# Patient Record
Sex: Male | Born: 1990 | Hispanic: No | Marital: Single | Smoking: Never smoker
Health system: Southern US, Community
[De-identification: ages and names within clinical notes are randomized; demographics above are authoritative.]

## PROBLEM LIST (undated history)

## (undated) HISTORY — PX: TONSILLECTOMY: SUR1361

## (undated) HISTORY — PX: NASAL SINUS SURGERY: SHX719

---
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
## 2012-12-29 ENCOUNTER — Encounter (HOSPITAL_COMMUNITY): Payer: Self-pay | Admitting: *Deleted

## 2012-12-29 ENCOUNTER — Emergency Department (HOSPITAL_COMMUNITY)
Admission: EM | Admit: 2012-12-29 | Discharge: 2012-12-29 | Disposition: A | Discharge status: Home / Self Care | Payer: Worker's Compensation | Attending: Emergency Medicine | Admitting: Emergency Medicine

## 2012-12-29 DIAGNOSIS — Y9289 Other specified places as the place of occurrence of the external cause: Secondary | ICD-10-CM | POA: Insufficient documentation

## 2012-12-29 DIAGNOSIS — Y990 Civilian activity done for income or pay: Secondary | ICD-10-CM | POA: Insufficient documentation

## 2012-12-29 DIAGNOSIS — Y9389 Activity, other specified: Secondary | ICD-10-CM | POA: Insufficient documentation

## 2012-12-29 DIAGNOSIS — S0101XA Laceration without foreign body of scalp, initial encounter: Secondary | ICD-10-CM

## 2012-12-29 DIAGNOSIS — IMO0002 Reserved for concepts with insufficient information to code with codable children: Secondary | ICD-10-CM | POA: Insufficient documentation

## 2012-12-29 DIAGNOSIS — R5381 Other malaise: Secondary | ICD-10-CM | POA: Insufficient documentation

## 2012-12-29 DIAGNOSIS — S0100XA Unspecified open wound of scalp, initial encounter: Secondary | ICD-10-CM | POA: Insufficient documentation

## 2012-12-29 MED ORDER — IBUPROFEN 800 MG PO TABS
800.0000 mg | ORAL_TABLET | Freq: Once | ORAL | Status: AC
  Administered 2012-12-29: 800 mg via ORAL
  Filled 2012-12-29: qty 1

## 2012-12-29 NOTE — ED Notes (Signed)
ZOX:[id number]<AV> Expected date:<BR> Expected time:<BR> Means of arrival:<BR> Comments:<BR> EMS/21 yo male with head lac

## 2012-12-29 NOTE — ED Notes (Signed)
Pt says about an hour ago he reached down to get something and hit his head on a 2x4. He denies LOC. Laceration to the top of his head.

## 2012-12-29 NOTE — ED Provider Notes (Signed)
Medical screening examination/treatment/procedure(s) were performed by non-physician practitioner and as supervising physician I was immediately available for consultation/collaboration.   Glynn Octave, MD 12/29/12 2250

## 2012-12-29 NOTE — ED Notes (Signed)
PA at bedside for Staple application

## 2012-12-29 NOTE — ED Provider Notes (Signed)
History  This chart was scribed for non-physician practitioner Dierdre Forth, PA-C working with Glynn Octave, MD, by Candelaria Stagers, ED Scribe. This patient was seen in room WTR8/WTR8 and the patient's care was started at 8:53 PM   CSN: [id number]  Arrival date & time 12/29/12  2020   First MD Initiated Contact with Patient 12/29/12 2032      Chief Complaint  Patient presents with  . Head Injury     The history is provided by the patient. No language interpreter was used.   Ronald Townsend is a 22 y.o. male who presents to the Emergency Department complaining of a head laceration after he hit his head on a 2x4 with metal hangers on it when coming up from a standing position while at work earlier today.  Pt states that coat hangers were on the 2x4 and he is not sure what cut his head.  Pt denies LOC.  He states he has been experiencing fatigue since the incident, but denies headache, vision changes or vomiting.  He denies headache or neck pain.  Last tetanus shot was two years ago.    PCP [facility].   History reviewed. No pertinent past medical history.  Past Surgical History  Procedure Laterality Date  . Nasal sinus surgery    . Tonsillectomy      No family history on file.  History  Substance Use Topics  . Smoking status: Never Smoker   . Smokeless tobacco: Not on file  . Alcohol Use: Yes      Review of Systems  Constitutional: Positive for fatigue.  HENT: Negative for neck pain.   Gastrointestinal: Negative for nausea and vomiting.  Skin: Positive for wound (laceration to posterior scalp).  Neurological: Negative for syncope.  All other systems reviewed and are negative.    Allergies  Review of patient's allergies indicates no known allergies.  Home Medications   Current Outpatient Rx  Name  Route  Sig  Dispense  Refill  . ibuprofen (ADVIL,MOTRIN) 200 MG tablet   Oral   Take 200 mg by mouth every 6 (six) hours as needed for pain.          Marland Kitchen loratadine (CLARITIN) 10 MG tablet   Oral   Take 10 mg by mouth daily.         . mometasone (NASONEX) 50 MCG/ACT nasal spray   Nasal   Place 2 sprays into the nose daily.           BP 134/83  Pulse 88  Temp(Src) 98.9 F (37.2 C) (Oral)  Resp 16  SpO2 99%  Physical Exam  Nursing note and vitals reviewed. Constitutional: He is oriented to person, place, and time. He appears well-developed and well-nourished. No distress.  HENT:  Head: Normocephalic.  Right Ear: Hearing and external ear normal.  Left Ear: Hearing and external ear normal.  Nose: Nose normal. No mucosal edema or rhinorrhea.  Mouth/Throat: Oropharynx is clear and moist. No edematous. No oropharyngeal exudate, posterior oropharyngeal edema, posterior oropharyngeal erythema or tonsillar abscesses.  Laceration to left temporal region of the scalp No hematoma or contusion  Eyes: Conjunctivae are normal. No scleral icterus.  Neck: Normal range of motion. Neck supple.  Cardiovascular: Normal rate, regular rhythm and intact distal pulses.   Pulmonary/Chest: Effort normal and breath sounds normal. No respiratory distress. He has no wheezes.  Abdominal: Soft. Bowel sounds are normal. He exhibits no mass. There is no tenderness. There is no rebound and no  guarding.  Musculoskeletal: Normal range of motion. He exhibits tenderness (mild tenderness at the site of the laceration; no hematoma or contusion). He exhibits no edema.  Neurological: He is alert and oriented to person, place, and time. He has normal reflexes. No cranial nerve deficit. He exhibits normal muscle tone. Coordination normal.  Speech is clear and goal oriented, follows commands Major Cranial nerves without deficit, no facial droop Normal strength in upper and lower extremities bilaterally including dorsiflexion and plantar flexion, strong and equal grip strength Sensation normal to light and sharp touch Moves extremities without ataxia, coordination  intact Normal finger to nose and rapid alternating movements Normal gait and balance  Skin: Skin is warm and dry. No rash noted. He is not diaphoretic. No erythema.  6cm laceration to the L temporal region of the scalp   Psychiatric: He has a normal mood and affect. His behavior is normal.    ED Course  LACERATION REPAIR Date/Time: 12/29/2012 9:38 PM Performed by: Dierdre Forth Authorized by: Dierdre Forth Consent: Verbal consent obtained. Risks and benefits: risks, benefits and alternatives were discussed Consent given by: patient Patient understanding: patient states understanding of the procedure being performed Patient consent: the patient's understanding of the procedure matches consent given Procedure consent: procedure consent matches procedure scheduled Relevant documents: relevant documents present and verified Site marked: the operative site was marked Required items: required blood products, implants, devices, and special equipment available Patient identity confirmed: verbally with patient and arm band Time out: Immediately prior to procedure a "time out" was called to verify the correct patient, procedure, equipment, support staff and site/side marked as required. Body area: head/neck Location details: scalp Laceration length: 6 cm Foreign bodies: no foreign bodies Tendon involvement: none Nerve involvement: none Vascular damage: no Patient sedated: no Preparation: Patient was prepped and draped in the usual sterile fashion. Irrigation solution: saline Irrigation method: syringe Amount of cleaning: standard Debridement: none Degree of undermining: none Skin closure: staples Number of sutures: 5 Technique: simple Approximation: close Approximation difficulty: simple Patient tolerance: Patient tolerated the procedure well with no immediate complications.     DIAGNOSTIC STUDIES: Oxygen Saturation is 99% on room air, normal by my interpretation.     COORDINATION OF CARE:  9:41 PM Discussed course of care with pt which includes laceration repair.  Gave strict post concussion return precautions.  Pt understands and agrees.   LACERATION REPAIR PROCEDURE NOTE The patient's identification was confirmed and consent was obtained. This procedure was performed by Dierdre Forth, PA-C at 8:59 PM. Site: left temporal scalp Sterile procedures observed Suture type/size: staples Length: 6 cm # of Sutures: 5 staples Tetanus UTD Site anesthetized, irrigated with NS, explored without evidence of foreign body, wound well approximated, site covered with dry, sterile dressing.  Patient tolerated procedure well without complications. Instructions for care discussed verbally and patient provided with additional written instructions for homecare and f/u.   Labs Reviewed - No data to display No results found.   1. Laceration of scalp without foreign body, initial encounter       MDM  Sandi Mealy presents for scalp laceration.  Tdap UTD < 5 years, not repeated.Pressure irrigation performed. Laceration occurred < 8 hours prior to repair which was well tolerated. Pt has no co morbidities to effect normal wound healing. Discussed suture home care w pt and answered questions. Pt to f-u for wound check and suture removal in 7 days. Pt without evidence of traumatic brain injury,  No evidence of concussion.  Pt with normal neurologic exam, A&Ox4, NAD, nontoxic, nonseptic appearing and ambulating without difficulty.  Pt is hemodynamically stable w no complaints prior to dc.  I have also discussed reasons to return immediately to the ER.  Patient expresses understanding and agrees with plan.   I personally performed the services described in this documentation, which was scribed in my presence. The recorded information has been reviewed and is accurate.   Dahlia Client Kreed Kauffman, PA-C 12/29/12 2141

## 2013-06-22 ENCOUNTER — Encounter: Payer: Self-pay | Admitting: *Deleted

## 2013-06-22 NOTE — Progress Notes (Signed)
Per Clydie Braun - schedule a lab appt on 11/11 at 10am.

## 2013-07-11 ENCOUNTER — Other Ambulatory Visit: Payer: BC Managed Care – PPO

## 2013-07-11 ENCOUNTER — Ambulatory Visit (HOSPITAL_BASED_OUTPATIENT_CLINIC_OR_DEPARTMENT_OTHER): Payer: BC Managed Care – PPO | Admitting: Genetic Counselor

## 2013-07-11 ENCOUNTER — Encounter: Payer: Self-pay | Admitting: Genetic Counselor

## 2013-07-11 DIAGNOSIS — Z1509 Genetic susceptibility to other malignant neoplasm: Secondary | ICD-10-CM

## 2013-07-11 DIAGNOSIS — Z803 Family history of malignant neoplasm of breast: Secondary | ICD-10-CM

## 2013-07-11 DIAGNOSIS — Z8349 Family history of other endocrine, nutritional and metabolic diseases: Secondary | ICD-10-CM

## 2013-07-11 DIAGNOSIS — IMO0002 Reserved for concepts with insufficient information to code with codable children: Secondary | ICD-10-CM

## 2013-07-11 NOTE — Progress Notes (Signed)
Ronald Townsend, a 22 y.o. male patient of Dr. Mila Palmer, was seen for discussion of his family history of hemochromatosis and breast cancer resulting from an ATM and CHEK2 mutation.  He presents to clinic today to discuss the possibility of a genetic predisposition to cancer, and to further clarify his risks, as well as his family members' risks for cancer.   HISTORY OF PRESENT ILLNESS: Ronald Townsend is a 22 y.o. male with no personal history of cancer.  He has never had a colonoscopy and has not started population screening for cancer.  No past medical history on file.  Past Surgical History  Procedure Laterality Date  . Nasal sinus surgery    . Tonsillectomy      History   Social History  . Marital Status: Single    Spouse Name: N/A    Number of Children: 0  . Years of Education: N/A   Occupational History  .     Social History Main Topics  . Smoking status: Never Smoker   . Smokeless tobacco: Not on file  . Alcohol Use: Yes  . Drug Use: No  . Sexual Activity: Not on file   Other Topics Concern  . Not on file   Social History Narrative  . No narrative on file   FAMILY HISTORY:  We obtained a detailed, 4-generation family history.  Significant diagnoses are listed below: Family History  Problem Relation Age of Onset  . Hemochromatosis Mother     carrier  . Prostate cancer Father 83  . Hemochromatosis Father     carrier  . Hemochromatosis Sister 27    affected  . Breast cancer Sister 80    CHEK2 and ATM mutaton  . Melanoma Maternal Uncle 40  . Breast cancer Paternal Aunt 51  . Breast cancer Paternal Grandmother 30  . HIV/AIDS Maternal Uncle   . Breast cancer Other     paternal great grandmother dx in her 89s   Patient's maternal ancestors are of [place] descent, and paternal ancestors are of [place] descent. There is no reported Ashkenazi Jewish ancestry. There is no known consanguinity.  GENETIC COUNSELING ASSESSMENT:  Ronald Townsend is a 22 y.o. male with a family history of a familial mutation in ATM and CHEK2 which increases the risk for cancer and Hemochromatosis which is an iron storage disease. We, therefore, discussed and recommended the following at today's visit.   DISCUSSION: We reviewed the characteristics, features and inheritance patterns of hereditary cancer syndromes. We also discussed genetic testing, including the appropriate family members to test, the process of testing, insurance coverage and turn-around-time for results. The ATM and CHEK2 genes are involved in the detection and surveillance of DNA damage. ATM phosphorylation of CHEK2 and BRCA1 (as well as CHEK2 phosphorylation of BRCA1) is critical for proper response to DNA double-strand breaks. This is believed to be the role ATM and CHEK2 have in breast cancer risk. ATM and CHEK2 are considered to be moderate risk breast cancer genes.   Having one ATM gene mutation increases the risk 5-fold for breast cancer under the age of 70, and 2-3 fold for breast cancer overall. In families with familial breast cancer that were negative for BRCA1 or BRCA2 genes, approximately 2.7% of women were found to have one ATM mutation. ATM mutations increase the risk for other cancers as well, although the exact risks have not been quantified. Other cancers include colon and pancreatic cancer.   Having one CHEK2  mutation increases the lifetime risk for breast cancer in women between 23-48%, depending on family history. An increased risk for male breast cancer is also seen, with about a 1% lifetime risk. Other cancers can also be seen in families with CHEK2 mutations, including prostate and colon cancer. Again, these risks have yet to be fully quantified. Ms. Lafitte sister has one of the common CHEK2 mutations, which is seen in about 1-2% of [place] European populations, that are BRCA negative.   Ronald Townsend has a 50% chance of carrying an ATM mutation and a 50%  chance of carrying a CHEK2 mutation.  Put another way, he has a 25% chance of carrying both mutations, a 50% chance of carrying one mutation or a 25% chance of not carrying a mutation at all.  Lastly, we discussed Hemochromatosis.  This is an iron storage condition that can increase the amount of iron in our blood and ultimately it can be stored in the liver.  Treatment for Hemochromatosis includes having blood drawn until the iron levels have normalized.  It is a recessive condition, and therefore Ronald Townsend has a 25% chance of being affected, and a 50% chance of being a carrier.  PLAN: After considering the risks, benefits, and limitations, Ronald Townsend provided informed consent to pursue genetic testing and the blood sample will be sent to [facility] for analysis of the CHEK2 and ATM genes. He also had blood drawn for hemochromatosis testing.   We discussed the implications of a positive, negative and/ or variant of uncertain significance genetic test result. Results should be available within approximately 1-2 weeks' time, at which point they will be disclosed by telephone to Ronald Townsend, as will any additional recommendations warranted by these results. Ronald Townsend will receive a summary of his genetic counseling visit and a copy of his results once available. This information will also be available in Epic. We encouraged Ronald Townsend to remain in contact with cancer genetics annually so that we can continuously update the family history and inform him of any changes in cancer genetics and testing that may be of benefit for his family. Ronald Townsend's questions were answered to his satisfaction today. Our contact information was provided should additional questions or concerns arise.  The patient was seen for a total of 60 minutes, greater than 50% of which was spent face-to-face counseling.  This note will also be sent to the referring provider via the electronic medical  record. The patient will be supplied with a summary of this genetic counseling discussion as well as educational information on the discussed hereditary cancer syndromes following the conclusion of their visit.   Patient was discussed with Dr. Drue Second.   _______________________________________________________________________ For Office Staff:  Number of people involved in session: 4 Was an Intern/ student involved with case: no

## 2013-07-19 ENCOUNTER — Telehealth: Payer: Self-pay | Admitting: Genetic Counselor

## 2013-07-19 NOTE — Telephone Encounter (Signed)
Revealed negative genetic test results.  I do not have the results on the hemochromatosis testing, but Dr. Cyndie Chime should be contacting them about that.

## 2013-07-20 ENCOUNTER — Encounter: Payer: Self-pay | Admitting: Genetic Counselor

## 2014-12-05 ENCOUNTER — Other Ambulatory Visit: Payer: Self-pay | Admitting: Family Medicine

## 2014-12-05 ENCOUNTER — Ambulatory Visit
Admission: RE | Admit: 2014-12-05 | Discharge: 2014-12-05 | Disposition: A | Discharge status: Home / Self Care | Payer: BLUE CROSS/BLUE SHIELD | Source: Ambulatory Visit | Attending: Family Medicine | Admitting: Family Medicine

## 2014-12-05 DIAGNOSIS — R109 Unspecified abdominal pain: Principal | ICD-10-CM

## 2014-12-05 DIAGNOSIS — G8929 Other chronic pain: Secondary | ICD-10-CM

## 2015-03-08 ENCOUNTER — Other Ambulatory Visit: Payer: Self-pay | Admitting: Psychiatry

## 2015-03-11 ENCOUNTER — Other Ambulatory Visit: Payer: Self-pay | Admitting: Psychiatry

## 2015-03-11 DIAGNOSIS — M5416 Radiculopathy, lumbar region: Secondary | ICD-10-CM

## 2015-03-18 ENCOUNTER — Other Ambulatory Visit: Payer: Self-pay

## 2015-03-19 ENCOUNTER — Ambulatory Visit
Admission: RE | Admit: 2015-03-19 | Discharge: 2015-03-19 | Disposition: A | Discharge status: Home / Self Care | Payer: BLUE CROSS/BLUE SHIELD | Source: Ambulatory Visit | Attending: Psychiatry | Admitting: Psychiatry

## 2015-03-19 DIAGNOSIS — M5416 Radiculopathy, lumbar region: Secondary | ICD-10-CM

## 2015-03-22 ENCOUNTER — Other Ambulatory Visit: Payer: Self-pay | Admitting: Psychiatry

## 2015-03-22 DIAGNOSIS — M546 Pain in thoracic spine: Secondary | ICD-10-CM

## 2015-03-30 ENCOUNTER — Ambulatory Visit
Admission: RE | Admit: 2015-03-30 | Discharge: 2015-03-30 | Disposition: A | Discharge status: Home / Self Care | Payer: BLUE CROSS/BLUE SHIELD | Source: Ambulatory Visit | Attending: Psychiatry | Admitting: Psychiatry

## 2015-03-30 DIAGNOSIS — M546 Pain in thoracic spine: Secondary | ICD-10-CM

## 2016-05-24 IMAGING — CR DG LUMBAR SPINE COMPLETE 4+V
4 series · 4 of 4 positions shown · non-contrast
Comparison: CT 12/05/2014 .

CLINICAL DATA: Pain radiating down right leg.  Initial evaluation .

EXAM:
LUMBAR SPINE - COMPLETE 4+ VIEW

[w lumbar spine ap]
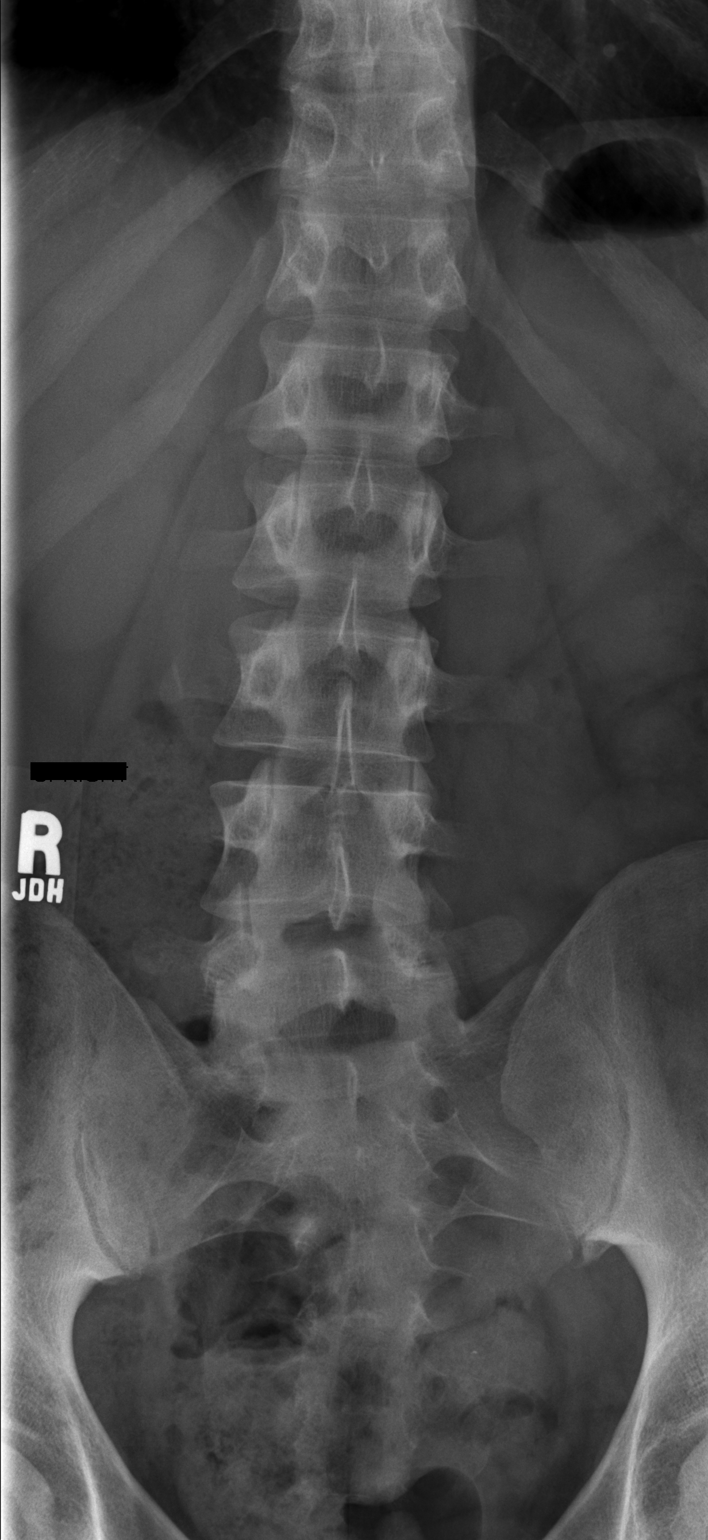

[w lumbar spine lat]
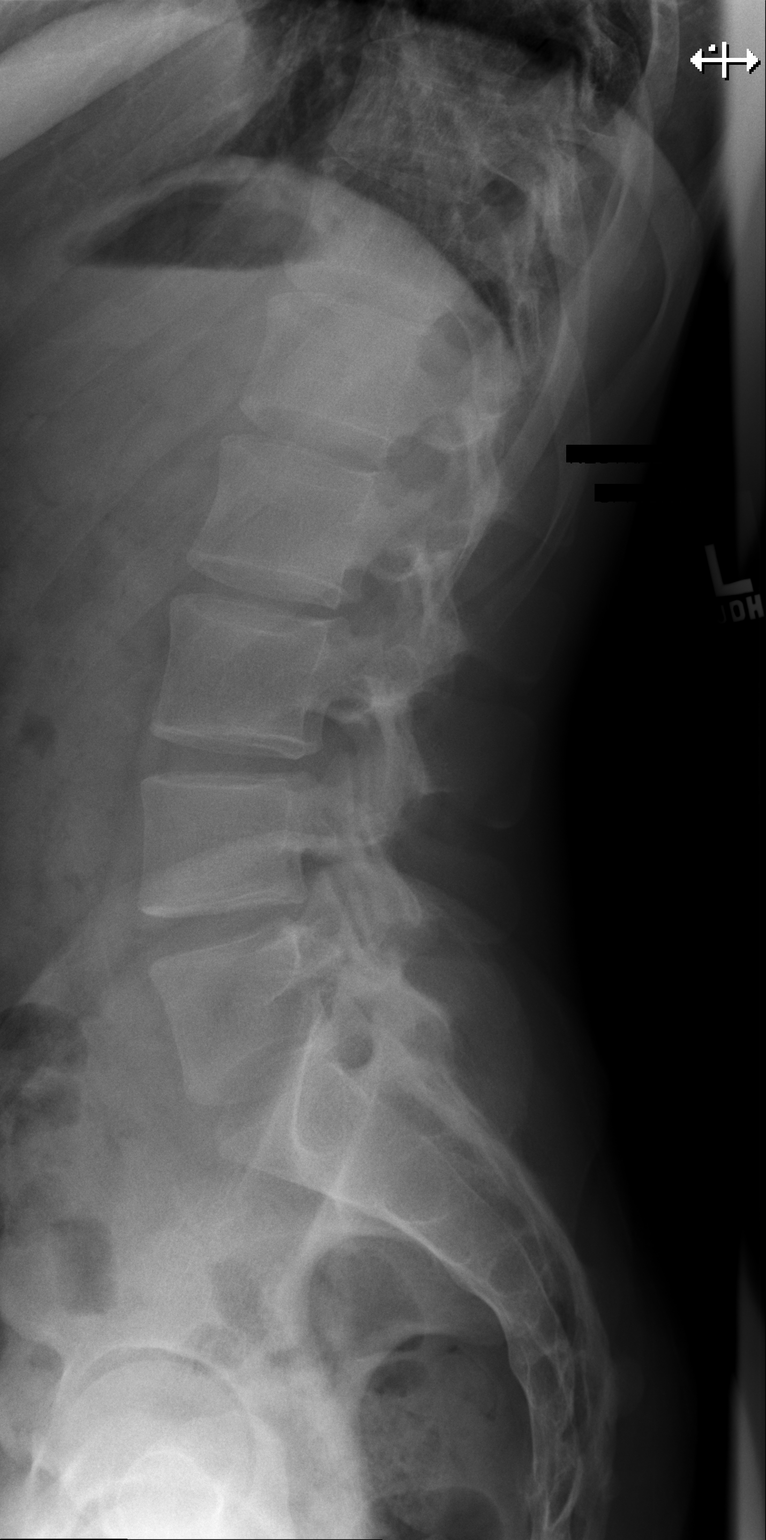

[w lumbar spine flexion]
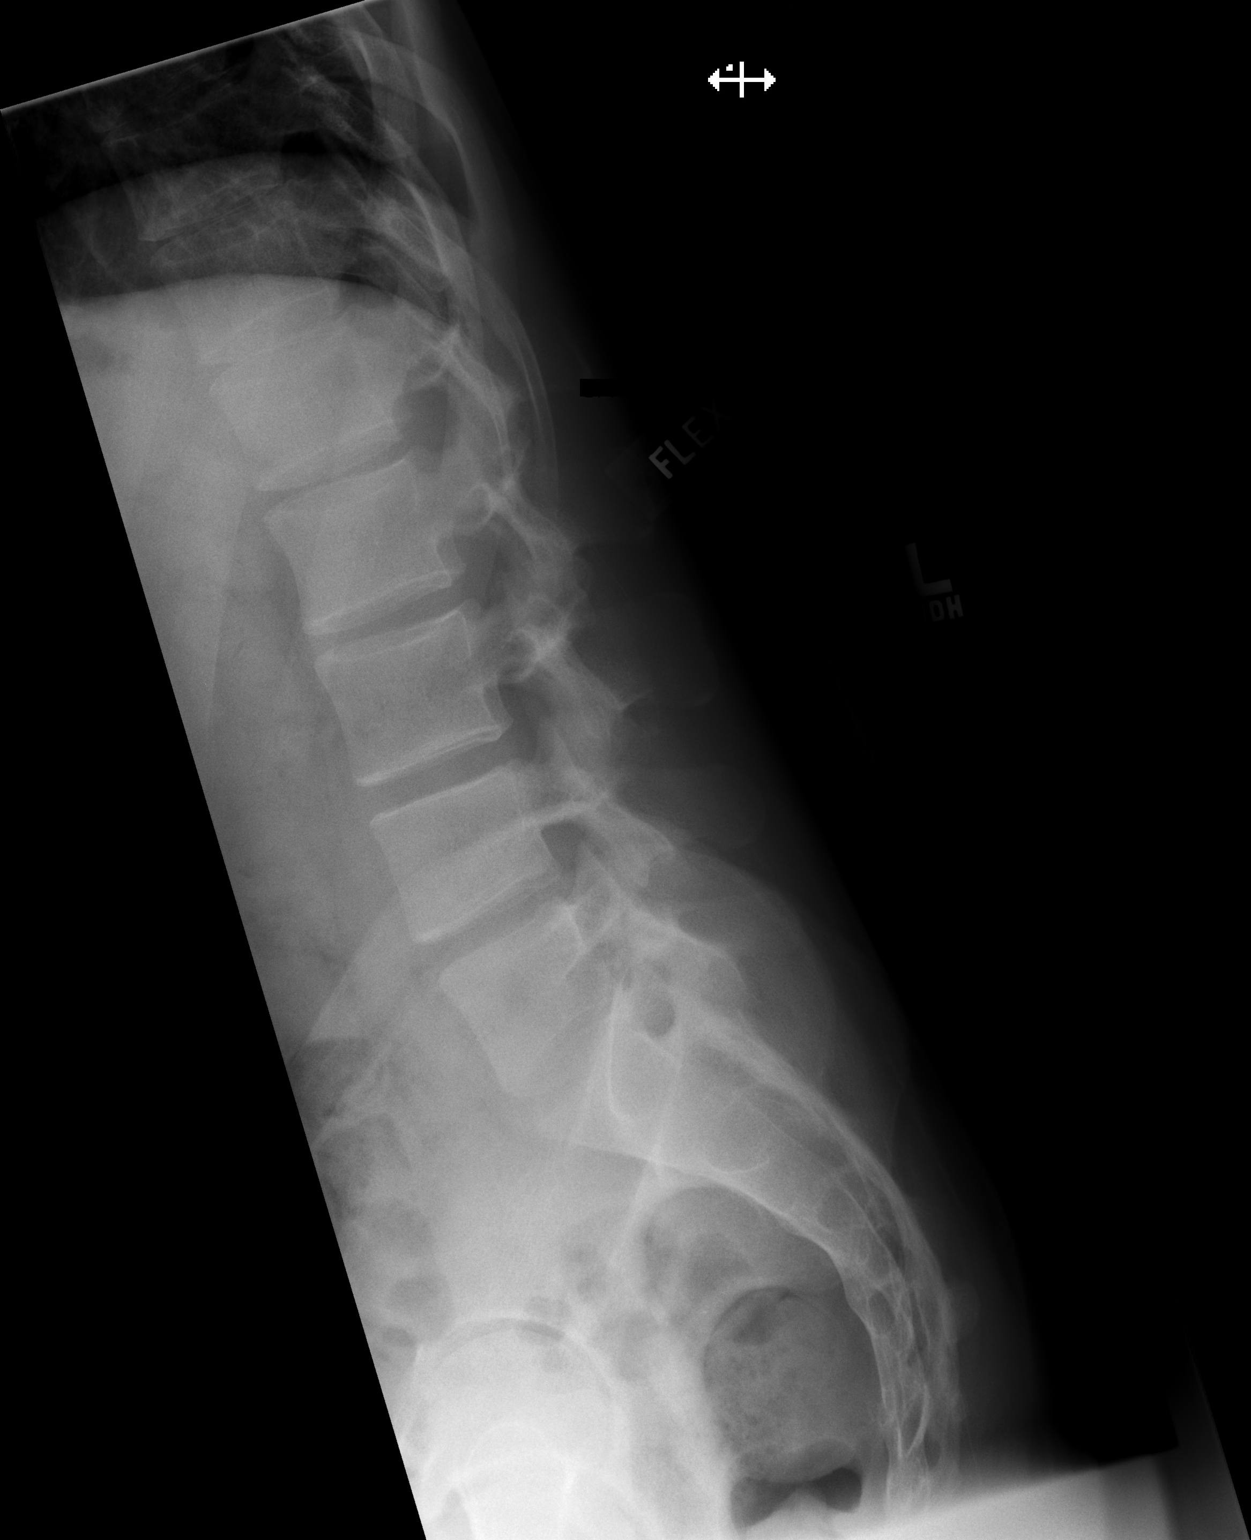

[w lumbar spine extension]
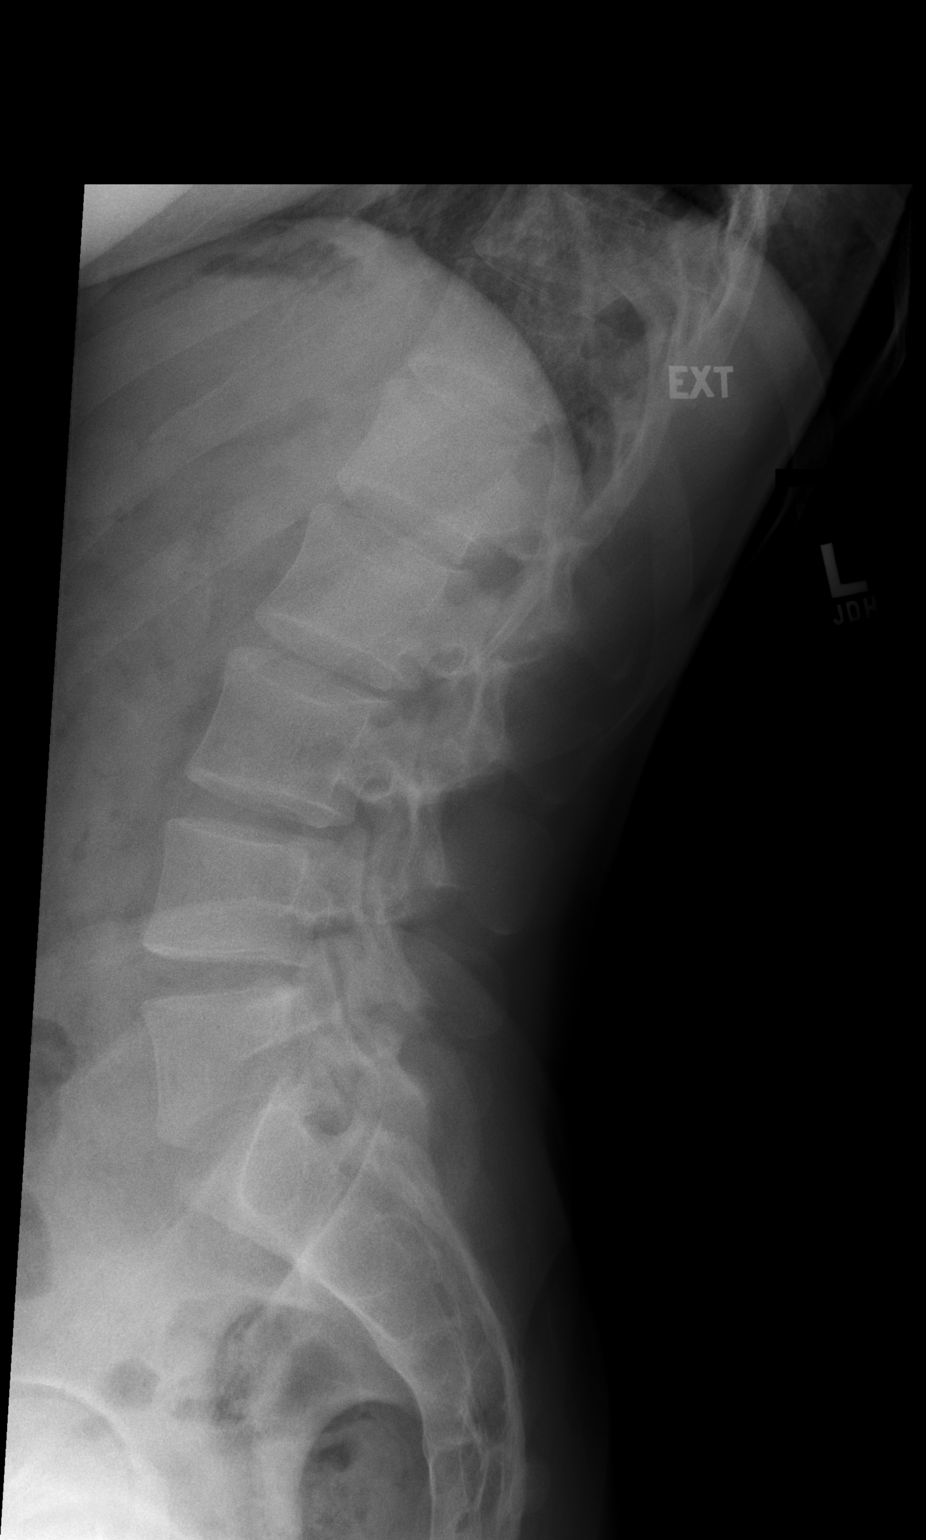

[4 of 4 positions shown; findings below may reference images not displayed]

FINDINGS: Soft tissues are unremarkable. Mild scoliosis concave left. No acute
bony abnormality identified. No flexion or extension deformity.
IMPRESSION: Mild scoliosis concave left. No acute or focal abnormality. No
flexion or extension deformity.

## 2017-12-27 DIAGNOSIS — H10022 Other mucopurulent conjunctivitis, left eye: Secondary | ICD-10-CM | POA: Diagnosis not present

## 2018-03-19 ENCOUNTER — Ambulatory Visit (HOSPITAL_COMMUNITY)
Admission: EM | Admit: 2018-03-19 | Discharge: 2018-03-19 | Disposition: A | Discharge status: Home / Self Care | Payer: Commercial Managed Care - PPO | Attending: Family Medicine | Admitting: Family Medicine

## 2018-03-19 ENCOUNTER — Encounter (HOSPITAL_COMMUNITY): Payer: Self-pay

## 2018-03-19 ENCOUNTER — Other Ambulatory Visit: Payer: Self-pay

## 2018-03-19 DIAGNOSIS — A630 Anogenital (venereal) warts: Secondary | ICD-10-CM | POA: Insufficient documentation

## 2018-03-19 DIAGNOSIS — A64 Unspecified sexually transmitted disease: Secondary | ICD-10-CM | POA: Diagnosis not present

## 2018-03-19 MED ORDER — IMIQUIMOD 5 % EX CREA: TOPICAL_CREAM | CUTANEOUS | 0 refills | Status: AC

## 2018-03-19 NOTE — ED Provider Notes (Signed)
  [facility]    CSN: [id number] Arrival date & time: 03/19/18  1256  Chief Complaint  Patient presents with  . SEXUALLY TRANSMITTED DISEASE    Subjective: Patient is a 27 y.o. male here for bump on penis.  This is a recurrent issue. Happened in past and had it frozen off. Has been using Imiquimod that has helped a little but he is running out. Is in monogamous relationship, no concerns for partner. Testes have been intermittently painful over past 2 days. No inj or change in activity. He is a runner and stays active, wears boxer shorts. Denies urinary complaints.  ROS: GU: As noted in HPI  History reviewed. No pertinent past medical history.  Objective: BP (!) 147/86 (BP Location: Left Arm)   Pulse 72   Temp 98.3 F (36.8 C) (Oral)   Resp 16   SpO2 100%  General: Awake, appears stated age HEENT: MMM, EOMi Heart: RRR, no murmurs Lungs: CTAB, no rales, wheezes or rhonchi. No accessory muscle use Abd: Soft, NT, ND, BS+ GU: there is a wart on L side of shaft, no vesicles; testes non-tender to palpation, circumcised, no other ext lesions Psych: Age appropriate judgment and insight, normal affect and mood  Assessment and Plan: Genital warts  Refill Aldara. Ck urine ancillary. F/u with derm team for discussion of possible surveillance.  The patient voiced understanding and agreement to the plan.       Sharlene DoryWendling, Nicholas Paul, DO 03/19/18 1418

## 2018-03-19 NOTE — Discharge Instructions (Addendum)
Ibuprofen 400-600 mg (2-3 over the counter strength tabs) every 6 hours as needed for pain.  OK to take Tylenol 1000 mg (2 extra strength tabs) or 975 mg (3 regular strength tabs) every 6 hours as needed.  Make an appointment with your dermatologist if this does not resolve. Might be worth it to discuss surveillance moving forward.  Give us 5-6 days to get the results of your testing back. In general, no news is good news. If you don't hear anything by next Friday and are curious, give us a call.

## 2018-03-19 NOTE — ED Triage Notes (Signed)
[facility] on penis, and slight throbbing that comes and goes on testicles x3 days, pt has hx of STD and would like to be checked again

## 2018-03-21 LAB — URINE CYTOLOGY ANCILLARY ONLY
CHLAMYDIA, DNA PROBE: NEGATIVE
Neisseria Gonorrhea: NEGATIVE
Trichomonas: NEGATIVE

## 2019-07-10 ENCOUNTER — Other Ambulatory Visit: Payer: Self-pay

## 2019-07-10 DIAGNOSIS — Z20822 Contact with and (suspected) exposure to covid-19: Secondary | ICD-10-CM

## 2019-07-11 LAB — NOVEL CORONAVIRUS, NAA: SARS-CoV-2, NAA: NOT DETECTED

## 2019-12-09 ENCOUNTER — Ambulatory Visit: Payer: Commercial Managed Care - PPO | Attending: Internal Medicine

## 2019-12-09 DIAGNOSIS — Z23 Encounter for immunization: Secondary | ICD-10-CM

## 2019-12-09 NOTE — Progress Notes (Signed)
   Covid-19 Vaccination Clinic  Name:  Ronald Townsend    MRN: [id number] DOB: [birth year]  12/09/2019  [name] was observed post Covid-19 immunization for 15 minutes without incident. He was provided with Vaccine Information Sheet and instruction to access the V-Safe system.   Mr. See was instructed to call 911 with any severe reactions post vaccine: Marland Kitchen Difficulty breathing  . Swelling of face and throat  . A fast heartbeat  . A bad rash all over body  . Dizziness and weakness   Immunizations Administered    Name Date Dose VIS Date Route   Pfizer COVID-19 Vaccine 12/09/2019 10:33 AM 0.3 mL 08/11/2019 Intramuscular   Manufacturer: [employer], [employer]   Lot: [id number]   NDC: [address]-1

## 2020-01-01 ENCOUNTER — Ambulatory Visit: Payer: Commercial Managed Care - PPO | Attending: Internal Medicine

## 2020-01-01 DIAGNOSIS — Z23 Encounter for immunization: Secondary | ICD-10-CM

## 2020-01-01 NOTE — Progress Notes (Signed)
   Covid-19 Vaccination Clinic  Name:  Ronald Townsend    MRN: [id number] DOB: Mar 10, 1991  01/01/2020  Mr. Ronald Townsend was observed post Covid-19 immunization for 15 minutes without incident. He was provided with Vaccine Information Sheet and instruction to access the V-Safe system.   Mr. Ronald Townsend was instructed to call 911 with any severe reactions post vaccine: Marland Kitchen Difficulty breathing  . Swelling of face and throat  . A fast heartbeat  . A bad rash all over body  . Dizziness and weakness   Immunizations Administered    Name Date Dose VIS Date Route   Pfizer COVID-19 Vaccine 01/01/2020 12:14 PM 0.3 mL 10/25/2018 Intramuscular   Manufacturer: [employer], [employer]   Lot: [id number]   NDC: [address]-9
